# Patient Record
Sex: Female | Born: 1985 | Race: Black or African American | Hispanic: No | Marital: Single | Smoking: Never smoker
Health system: Southern US, Community
[De-identification: ages and names within clinical notes are randomized; demographics above are authoritative.]

## PROBLEM LIST (undated history)

## (undated) DIAGNOSIS — D573 Sickle-cell trait: Secondary | ICD-10-CM

## (undated) DIAGNOSIS — E7800 Pure hypercholesterolemia, unspecified: Secondary | ICD-10-CM

---

## 2011-06-04 ENCOUNTER — Encounter: Payer: Self-pay | Admitting: *Deleted

## 2011-06-04 ENCOUNTER — Emergency Department (HOSPITAL_BASED_OUTPATIENT_CLINIC_OR_DEPARTMENT_OTHER)
Admission: EM | Admit: 2011-06-04 | Discharge: 2011-06-05 | Disposition: A | Discharge status: Other Acute Inpatient Hospital | Payer: Medicaid Other | Attending: Emergency Medicine | Admitting: Emergency Medicine

## 2011-06-04 DIAGNOSIS — N12 Tubulo-interstitial nephritis, not specified as acute or chronic: Secondary | ICD-10-CM

## 2011-06-04 DIAGNOSIS — O26899 Other specified pregnancy related conditions, unspecified trimester: Secondary | ICD-10-CM

## 2011-06-04 DIAGNOSIS — N390 Urinary tract infection, site not specified: Secondary | ICD-10-CM

## 2011-06-04 DIAGNOSIS — O2390 Unspecified genitourinary tract infection in pregnancy, unspecified trimester: Secondary | ICD-10-CM | POA: Insufficient documentation

## 2011-06-04 DIAGNOSIS — R109 Unspecified abdominal pain: Secondary | ICD-10-CM | POA: Insufficient documentation

## 2011-06-04 HISTORY — DX: Sickle-cell trait: D57.3

## 2011-06-04 LAB — BASIC METABOLIC PANEL
BUN: 5 mg/dL — ABNORMAL LOW (ref 6–23)
CO2: 25 mEq/L (ref 19–32)
Calcium: 9.4 mg/dL (ref 8.4–10.5)
Chloride: 102 mEq/L (ref 96–112)
Creatinine, Ser: 0.7 mg/dL (ref 0.50–1.10)
GFR calc Af Amer: >60 mL/min (ref >60)
GFR calc non Af Amer: >60 mL/min (ref >60)
Glucose, Bld: 86 mg/dL (ref 70–99)
Potassium: 3.2 mEq/L — ABNORMAL LOW (ref 3.5–5.1)
Sodium: 137 mEq/L (ref 135–145)

## 2011-06-04 LAB — URINALYSIS, ROUTINE W REFLEX MICROSCOPIC
Glucose, UA: 100 mg/dL — AB
Ketones, ur: NEGATIVE mg/dL
Nitrite: NEGATIVE
Protein, ur: NEGATIVE mg/dL
Urobilinogen, UA: 0.2 mg/dL (ref 0.0–1.0)

## 2011-06-04 LAB — DIFFERENTIAL
Basophils Relative: 0 % (ref 0–1)
Eosinophils Absolute: 0.3 10*3/uL (ref 0.0–0.7)
Lymphs Abs: 2.1 10*3/uL (ref 0.7–4.0)
Monocytes Absolute: 1.3 10*3/uL — ABNORMAL HIGH (ref 0.1–1.0)
Monocytes Relative: 9 % (ref 3–12)
Neutrophils Relative %: 74 % (ref 43–77)

## 2011-06-04 LAB — CBC
HCT: 32.5 % — ABNORMAL LOW (ref 36.0–46.0)
Hemoglobin: 11.4 g/dL — ABNORMAL LOW (ref 12.0–15.0)
MCH: 31.5 pg (ref 26.0–34.0)
MCHC: 35.1 g/dL (ref 30.0–36.0)
MCV: 89.8 fL (ref 78.0–100.0)
Platelets: 309 10*3/uL (ref 150–400)
RBC: 3.62 MIL/uL — ABNORMAL LOW (ref 3.87–5.11)
RDW: 11.6 % (ref 11.5–15.5)
WBC: 14.1 10*3/uL — ABNORMAL HIGH (ref 4.0–10.5)

## 2011-06-04 LAB — URINE MICROSCOPIC-ADD ON

## 2011-06-04 NOTE — ED Notes (Signed)
I placed a call to [facility] for Dr. Alfredo Batty. Paged with [phone] and call returned @1120 .

## 2011-06-04 NOTE — ED Notes (Signed)
Pt is 4 months pregnant. C/O RLQ and right flank pain onset last Wed. +nausea. Denies discharge or UTI S/S

## 2011-06-04 NOTE — ED Provider Notes (Signed)
History     CSN: [id number] Arrival date & time: 06/04/2011 10:21 PM  Scribed for Olivia Mackie, MD, the patient was seen in room MH08/MH08. This chart was scribed by Katha Cabal. This patient's care was started at 10:52 PM.   Chief Complaint  Patient presents with  . Abdominal Pain     HPI Donna Pearson is a 25 y.o. female who presents to the Emergency Department complaining of gradual worsening of bilateral suprapubic abdominal pain with associated right flank pain, light headedness and nausea onset 4 days ago.  Denies dysuria, fever, vomiting, vaginal discharge, vaginal bleeding, urinary frequency and urinary urgency, and injury.  Patient has not taken anything for pain.  Patient is 17wks and 5 days pregnant (due date 11/07/11).  Denies round ligament pain with first pregnancy but did have a C Section (prolonged labor).   Patient has a 25 year old at home.  Patient states she was trying to wait until her next appointment to discuss pain with her OB GYN.     Dr. Alfredo Batty with [facility]   PAST MEDICAL HISTORY:  History reviewed. No pertinent past medical history.  PAST SURGICAL HISTORY:  C Section 2009  FAMILY HISTORY:  History reviewed. No pertinent family history.   SOCIAL HISTORY: History   Social History  . Marital Status: Single    Spouse Name: N/A    Number of Children: N/A  . Years of Education: N/A   Social History Main Topics  . Smoking status: None  . Smokeless tobacco: None  . Alcohol Use:   . Drug Use:   . Sexually Active:    Other Topics Concern  . None   Social History Narrative  . None    OB History    Grav Para Term Preterm Abortions TAB SAB Ect Mult Living   2 1              Review of Systems 10 Systems reviewed and are negative for acute change except as noted in the HPI.  Allergies  Review of patient's allergies indicates no known allergies.  Home Medications   Current Outpatient Rx  Name Route Sig Dispense Refill  .  PRENATAL 27-0.8 MG PO TABS Oral Take 1 tablet by mouth daily.        Physical Exam    BP 125/85  Pulse 85  Temp(Src) 99 F (37.2 C) (Oral)  Resp 18  Ht 5\' 5"  (1.651 m)  Wt 145 lb (65.772 kg)  BMI 24.13 kg/m2  SpO2 100%  Physical Exam  Nursing note and vitals reviewed. Constitutional: She is oriented to person, place, and time. She appears well-developed and well-nourished.  HENT:  Head: Normocephalic and atraumatic.  Eyes: Pupils are equal, round, and reactive to light.  Neck: Neck supple.  Cardiovascular: Normal rate and regular rhythm.   Pulmonary/Chest: Effort normal. No respiratory distress.  Abdominal: Soft. There is CVA tenderness (right ). There is no rebound and no guarding.       Suprapubic and  RLQ tenderness   Genitourinary:       Uterus 2 fingers below umbilicus   Musculoskeletal: Normal range of motion.  Neurological: She is alert and oriented to person, place, and time.  Skin: Skin is warm and dry. No rash noted.  Psychiatric: She has a normal mood and affect. Her behavior is normal.    ED Course  Procedures  OTHER DATA REVIEWED: Nursing notes, vital signs, and past medical records reviewed.  DIAGNOSTIC STUDIES: Oxygen Saturation is 100% on room air, normal by my interpretation.     LABS / RADIOLOGY:  Results for orders placed during the hospital encounter of 06/04/11  URINALYSIS, ROUTINE W REFLEX MICROSCOPIC      Component Value Range   Color, Urine YELLOW  YELLOW    Appearance CLOUDY (*) CLEAR    Specific Gravity, Urine 1.012  1.005 - 1.030    pH 5.5  5.0 - 8.0    Glucose, UA 100 (*) NEGATIVE (mg/dL)   Hgb urine dipstick NEGATIVE  NEGATIVE    Bilirubin Urine NEGATIVE  NEGATIVE    Ketones, ur NEGATIVE  NEGATIVE (mg/dL)   Protein, ur NEGATIVE  NEGATIVE (mg/dL)   Urobilinogen, UA 0.2  0.0 - 1.0 (mg/dL)   Nitrite NEGATIVE  NEGATIVE    Leukocytes, UA MODERATE (*) NEGATIVE   URINE MICROSCOPIC-ADD ON      Component Value Range   Squamous  Epithelial / LPF FEW (*) RARE    WBC, UA 21-50  <3 (WBC/hpf)   RBC / HPF 0-2  <3 (RBC/hpf)   Bacteria, UA MANY (*) RARE   PREGNANCY, URINE      Component Value Range   Preg Test, Ur POSITIVE    CBC      Component Value Range   WBC 14.1 (*) 4.0 - 10.5 (K/uL)   RBC 3.62 (*) 3.87 - 5.11 (MIL/uL)   Hemoglobin 11.4 (*) 12.0 - 15.0 (g/dL)   HCT 16.1 (*) 09.6 - 46.0 (%)   MCV 89.8  78.0 - 100.0 (fL)   MCH 31.5  26.0 - 34.0 (pg)   MCHC 35.1  30.0 - 36.0 (g/dL)   RDW 04.5  40.9 - 81.1 (%)   Platelets 309  150 - 400 (K/uL)  DIFFERENTIAL      Component Value Range   Neutrophils Relative 74  43 - 77 (%)   Neutro Abs 10.5 (*) 1.7 - 7.7 (K/uL)   Lymphocytes Relative 15  12 - 46 (%)   Lymphs Abs 2.1  0.7 - 4.0 (K/uL)   Monocytes Relative 9  3 - 12 (%)   Monocytes Absolute 1.3 (*) 0.1 - 1.0 (K/uL)   Eosinophils Relative 2  0 - 5 (%)   Eosinophils Absolute 0.3  0.0 - 0.7 (K/uL)   Basophils Relative 0  0 - 1 (%)   Basophils Absolute 0.0  0.0 - 0.1 (K/uL)  BASIC METABOLIC PANEL      Component Value Range   Sodium 137  135 - 145 (mEq/L)   Potassium 3.2 (*) 3.5 - 5.1 (mEq/L)   Chloride 102  96 - 112 (mEq/L)   CO2 25  19 - 32 (mEq/L)   Glucose, Bld 86  70 - 99 (mg/dL)   BUN 5 (*) 6 - 23 (mg/dL)   Creatinine, Ser 9.14  0.50 - 1.10 (mg/dL)   Calcium 9.4  8.4 - 78.2 (mg/dL)   GFR calc non Af Amer >60  >60 (mL/min)   GFR calc Af Amer >60  >60 (mL/min)     No results found.  ED COURSE / COORDINATION OF CARE:  10:26 PM  Order for fetal heart tone assessment.  11:19PM Consult placed for Dr. Alfredo Batty OB GYN.  Dr. Alfredo Batty was paged.   11:20PM  Dr. Achilles Dunk OB GYN returned consult call.   11:28PM Left antecubital IV placed.   11:39 PM  Fetal heart tones 154.    Orders Placed This Encounter  Procedures  . Urine culture  .  Urinalysis with microscopic  . Urine microscopic-add on  . Pregnancy, urine  . CBC  . Differential  . Basic metabolic panel  . Assess fetal heart tones  . Saline lock IV     12:27 AM Discussed elevated wbc with Dr Achilles Dunk, but overall nontoxic appearance, no fevers, but possible early pyelonephritis.  Initially he requested iv ancef and f/u in office in morning vs being admitted to [facility] if patient desired.  He reviewed the records and found she had sickle cell trait, now feels pt better served with admission to his service.  Will admit to [facility].  Findings discussed with patient.   MDM: Urinary tract infection in pregnancy, possible early pyelonephritis    MEDICATIONS GIVEN IN THE E.D. Scheduled Meds:  Continuous Infusions:    . ceFAZolin (ANCEF) IV         I personally performed the services described in this documentation, which was scribed in my presence. The recorded information has been reviewed and considered. Olivia Mackie, MD           Olivia Mackie, MD 06/05/11 [phone]

## 2011-06-05 ENCOUNTER — Encounter (HOSPITAL_BASED_OUTPATIENT_CLINIC_OR_DEPARTMENT_OTHER): Payer: Self-pay

## 2011-06-05 MED ORDER — SODIUM CHLORIDE 0.9 % IV SOLN
Freq: Once | INTRAVENOUS | Status: AC
  Administered 2011-06-05: via INTRAVENOUS

## 2011-06-05 MED ORDER — CEFAZOLIN SODIUM 1-5 GM-% IV SOLN
1.0000 g | Freq: Once | INTRAVENOUS | Status: AC
  Administered 2011-06-05: 1 g via INTRAVENOUS
  Filled 2011-06-05: qty 50

## 2011-06-05 MED ORDER — ACETAMINOPHEN 325 MG PO TABS
650.0000 mg | ORAL_TABLET | Freq: Once | ORAL | Status: AC
  Administered 2011-06-05: 650 mg via ORAL
  Filled 2011-06-05: qty 2

## 2011-06-05 NOTE — ED Notes (Signed)
All LDA's (lines, drains, airways, and wounds) were removed from documentation for discharge purposes.  At time of discharge all LDA's remained intact as were previously documented.    

## 2011-06-05 NOTE — ED Notes (Signed)
According to [facility] transport team pt is now having 9/10 back pain, they are requesting that patient receive pain medication.  Dr Norlene Campbell notified.

## 2011-06-06 LAB — URINE CULTURE: Culture  Setup Time: 201209170049

## 2014-07-20 ENCOUNTER — Encounter (HOSPITAL_BASED_OUTPATIENT_CLINIC_OR_DEPARTMENT_OTHER): Payer: Self-pay

## 2017-12-12 ENCOUNTER — Emergency Department (HOSPITAL_BASED_OUTPATIENT_CLINIC_OR_DEPARTMENT_OTHER)
Admission: EM | Admit: 2017-12-12 | Discharge: 2017-12-12 | Disposition: A | Discharge status: Home / Self Care | Payer: Self-pay | Attending: Emergency Medicine | Admitting: Emergency Medicine

## 2017-12-12 ENCOUNTER — Encounter (HOSPITAL_BASED_OUTPATIENT_CLINIC_OR_DEPARTMENT_OTHER): Payer: Self-pay

## 2017-12-12 DIAGNOSIS — R300 Dysuria: Secondary | ICD-10-CM | POA: Insufficient documentation

## 2017-12-12 DIAGNOSIS — N3001 Acute cystitis with hematuria: Secondary | ICD-10-CM | POA: Insufficient documentation

## 2017-12-12 DIAGNOSIS — M545 Low back pain, unspecified: Secondary | ICD-10-CM

## 2017-12-12 DIAGNOSIS — G8929 Other chronic pain: Secondary | ICD-10-CM | POA: Insufficient documentation

## 2017-12-12 DIAGNOSIS — Z79899 Other long term (current) drug therapy: Secondary | ICD-10-CM | POA: Insufficient documentation

## 2017-12-12 LAB — URINALYSIS, ROUTINE W REFLEX MICROSCOPIC
BILIRUBIN URINE: NEGATIVE
Glucose, UA: NEGATIVE mg/dL
Ketones, ur: NEGATIVE mg/dL
NITRITE: NEGATIVE
PROTEIN: NEGATIVE mg/dL
Specific Gravity, Urine: 1.02 (ref 1.005–1.030)
pH: 6 (ref 5.0–8.0)

## 2017-12-12 LAB — BASIC METABOLIC PANEL
ANION GAP: 7 (ref 5–15)
BUN: 10 mg/dL (ref 6–20)
CALCIUM: 8.6 mg/dL — AB (ref 8.9–10.3)
CO2: 24 mmol/L (ref 22–32)
Chloride: 107 mmol/L (ref 101–111)
Creatinine, Ser: 0.96 mg/dL (ref 0.44–1.00)
GFR calc Af Amer: >60 mL/min (ref >60)
Glucose, Bld: 93 mg/dL (ref 65–99)
POTASSIUM: 3.9 mmol/L (ref 3.5–5.1)
SODIUM: 138 mmol/L (ref 135–145)

## 2017-12-12 LAB — URINALYSIS, MICROSCOPIC (REFLEX)

## 2017-12-12 LAB — PREGNANCY, URINE: PREG TEST UR: NEGATIVE

## 2017-12-12 LAB — CBC
HCT: 36.2 % (ref 36.0–46.0)
Hemoglobin: 12.4 g/dL (ref 12.0–15.0)
MCH: 30.2 pg (ref 26.0–34.0)
MCHC: 34.3 g/dL (ref 30.0–36.0)
MCV: 88.1 fL (ref 78.0–100.0)
PLATELETS: 352 10*3/uL (ref 150–400)
RBC: 4.11 MIL/uL (ref 3.87–5.11)
RDW: 12 % (ref 11.5–15.5)
WBC: 9.5 10*3/uL (ref 4.0–10.5)

## 2017-12-12 MED ORDER — CEPHALEXIN 500 MG PO CAPS: 500.0000 mg | ORAL_CAPSULE | Freq: Four times a day (QID) | ORAL | 0 refills | Status: DC

## 2017-12-12 MED FILL — CEPHALEXIN 500 MG CAPSULE: 500 | 7 days supply | Qty: 28 | Fill #0

## 2017-12-12 NOTE — ED Provider Notes (Signed)
[facility] EMERGENCY DEPARTMENT Provider Note   CSN: [id number] Arrival date & time: 12/12/17  0809     History   Chief Complaint Chief Complaint  Patient presents with  . Back Pain    HPI Donna Pearson is a 32 y.o. female.  Patient with complaint of bilateral low back pain for about a year.  On and off.  Little worse the past few days.  No history of injury or fall.  No radiation of pain in the buttocks or the legs no numbness or weakness in the feet.  Also associated with some dysuria just for the past few days.  Patient denies any fevers nausea vomiting.  Any unusual rash.  Patient states that the back pain is bilateral both sides equally.     Past Medical History:  Diagnosis Date  . Sickle cell trait (HCC)     There are no active problems to display for this patient.   History reviewed. No pertinent surgical history.   OB History    Gravida  2   Para  1   Term      Preterm      AB      Living        SAB      TAB      Ectopic      Multiple      Live Births               Home Medications    Prior to Admission medications   Medication Sig Start Date End Date Taking? Authorizing Provider  cephALEXin (KEFLEX) 500 MG capsule Take 1 capsule (500 mg total) by mouth 4 (four) times daily. 12/12/17   Vanetta MuldersZackowski, Sharna Gabrys, MD  Prenatal Vit-Fe Fumarate-FA (MULTIVITAMIN-PRENATAL) 27-0.8 MG TABS Take 1 tablet by mouth daily.      [provider]    Family History No family history on file.  Social History Social History   Tobacco Use  . Smoking status: Never Smoker  . Smokeless tobacco: Never Used  Substance Use Topics  . Alcohol use: Not Currently  . Drug use: Not Currently     Allergies   Patient has no known allergies.   Review of Systems Review of Systems  Constitutional: Negative for fever.  HENT: Negative for congestion.   Eyes: Negative for redness.  Respiratory: Negative for shortness of breath.     Cardiovascular: Negative for chest pain.  Gastrointestinal: Negative for nausea and vomiting.  Genitourinary: Positive for dysuria.  Musculoskeletal: Positive for back pain.  Skin: Negative for rash.  Neurological: Negative for weakness and numbness.  Hematological: Does not bruise/bleed easily.  Psychiatric/Behavioral: Negative for confusion.     Physical Exam Updated Vital Signs BP 137/90 (BP Location: Left Arm)   Pulse 81   Temp 99.4 F (37.4 C) (Oral)   Resp 18   LMP 11/20/2017   SpO2 100%   Breastfeeding? Unknown   Physical Exam  Constitutional: She is oriented to person, place, and time. She appears well-developed and well-nourished. No distress.  HENT:  Head: Normocephalic and atraumatic.  Mouth/Throat: Oropharynx is clear and moist.  Eyes: Pupils are equal, round, and reactive to light. Conjunctivae and EOM are normal.  Neck: Neck supple.  Cardiovascular: Normal rate and regular rhythm.  Pulmonary/Chest: Effort normal and breath sounds normal.  Abdominal: Soft. Bowel sounds are normal. There is no tenderness.  Musculoskeletal: Normal range of motion. She exhibits edema.  Back nontender to palpation.  Neurological: She is  alert and oriented to person, place, and time. No cranial nerve deficit or sensory deficit. She exhibits normal muscle tone. Coordination normal.  Skin: Skin is warm. No rash noted.  Nursing note and vitals reviewed.    ED Treatments / Results  Labs (all labs ordered are listed, but only abnormal results are displayed) Labs Reviewed  URINALYSIS, ROUTINE W REFLEX MICROSCOPIC - Abnormal; Notable for the following components:      Result Value   APPearance CLOUDY (*)    Hgb urine dipstick LARGE (*)    Leukocytes, UA LARGE (*)    All other components within normal limits  BASIC METABOLIC PANEL - Abnormal; Notable for the following components:   Calcium 8.6 (*)    All other components within normal limits  URINALYSIS, MICROSCOPIC (REFLEX) -  Abnormal; Notable for the following components:   Bacteria, UA MANY (*)    Squamous Epithelial / LPF 0-5 (*)    All other components within normal limits  URINE CULTURE  PREGNANCY, URINE  CBC    EKG None  Radiology No results found.  Procedures Procedures (including critical care time)  Medications Ordered in ED Medications - No data to display   Initial Impression / Assessment and Plan / ED Course  I have reviewed the triage vital signs and the nursing notes.  Pertinent labs & imaging results that were available during my care of the patient were reviewed by me and considered in my medical decision making (see chart for details).     Patient with complaint of low back pain on and off for about a year.  Last few days some increased but not severe.  Also with some dysuria.  Patient is concerned that maybe her kidney are not working properly.  Urinalysis here today consistent with urinary tract infection.  Pregnancy test negative.  Renal functions normal.  The low back pain based on the duration sounds more musculoskeletal in nature.  Patient will be treated with Keflex for the urinary tract infection.  Work note provided.  Final Clinical Impressions(s) / ED Diagnoses   Final diagnoses:  Acute cystitis with hematuria  Chronic low back pain without sciatica, unspecified back pain laterality    ED Discharge Orders        Ordered    cephALEXin (KEFLEX) 500 MG capsule  4 times daily     12/12/17 1136       Vanetta Mulders, MD 12/12/17 1140

## 2017-12-12 NOTE — Discharge Instructions (Addendum)
Urine sent for culture.  Take the antibiotic Keflex as directed for the next 7 days.  Would expect improvement over the next 2 days.  Return for any new or worse symptoms.  Suspect that the low back pain which is been present for many months is most likely musculoskeletal in nature and not related to today's finding of the urinary tract infection.  Kidney function is normal.

## 2017-12-12 NOTE — ED Triage Notes (Signed)
Pt c/o lower back pressure for a yr, concerns it may be her kidneys

## 2017-12-14 LAB — URINE CULTURE: Culture: >=100000 — AB

## 2017-12-15 ENCOUNTER — Telehealth: Payer: Self-pay

## 2017-12-15 NOTE — Telephone Encounter (Signed)
Post ED Visit - Positive Culture Follow-up  Culture report reviewed by antimicrobial stewardship pharmacist:  []  Donna Pearson, Pharm.D. []  Donna Pearson, Pharm.D., BCPS AQ-ID []  Donna Pearson, Pharm.D., BCPS []  Donna Pearson, Pharm.D., BCPS []  Donna Pearson, [address] Rainbow BoulevardPharm.D., BCPS, AAHIVP []  Donna Pearson, Pharm.D., BCPS, AAHIVP []  Donna Pearson, PharmD, BCPS []  Donna Pearson, PharmD []  Donna Pearson, PharmD, BCPS [facility] Pharm D Positive urine culture Treated with Cephalexin, organism sensitive to the same and no further patient follow-up is required at this time.  Donna Pearson, Donna Pearson 12/15/2017, 10:42 AM

## 2019-03-13 ENCOUNTER — Other Ambulatory Visit: Payer: Self-pay

## 2019-03-13 ENCOUNTER — Emergency Department (HOSPITAL_BASED_OUTPATIENT_CLINIC_OR_DEPARTMENT_OTHER)
Admission: EM | Admit: 2019-03-13 | Discharge: 2019-03-13 | Disposition: A | Discharge status: Home / Self Care | Payer: Self-pay | Attending: Emergency Medicine | Admitting: Emergency Medicine

## 2019-03-13 ENCOUNTER — Encounter (HOSPITAL_BASED_OUTPATIENT_CLINIC_OR_DEPARTMENT_OTHER): Payer: Self-pay | Admitting: *Deleted

## 2019-03-13 DIAGNOSIS — N390 Urinary tract infection, site not specified: Secondary | ICD-10-CM | POA: Insufficient documentation

## 2019-03-13 DIAGNOSIS — R300 Dysuria: Secondary | ICD-10-CM

## 2019-03-13 DIAGNOSIS — Z79899 Other long term (current) drug therapy: Secondary | ICD-10-CM | POA: Insufficient documentation

## 2019-03-13 LAB — URINALYSIS, ROUTINE W REFLEX MICROSCOPIC
Bilirubin Urine: NEGATIVE
Glucose, UA: NEGATIVE mg/dL
Ketones, ur: NEGATIVE mg/dL
Nitrite: NEGATIVE
Protein, ur: NEGATIVE mg/dL
Specific Gravity, Urine: 1.02 (ref 1.005–1.030)
pH: 6 (ref 5.0–8.0)

## 2019-03-13 LAB — URINALYSIS, MICROSCOPIC (REFLEX)

## 2019-03-13 LAB — PREGNANCY, URINE: Preg Test, Ur: NEGATIVE

## 2019-03-13 MED ORDER — CEPHALEXIN 500 MG PO CAPS: 500.0000 mg | ORAL_CAPSULE | Freq: Two times a day (BID) | ORAL | 0 refills | Status: AC

## 2019-03-13 NOTE — Discharge Instructions (Addendum)
I have prescribed antibiotics to help treat  your urinary tract infection. Please take one tablet twice a day for the next 7 days. If you experience any back pain, fever please return to the ED.

## 2019-03-13 NOTE — ED Triage Notes (Signed)
Urinary frequency and dysuria since this am. She has had diarrhea all day.

## 2019-03-13 NOTE — ED Notes (Signed)
ED Provider at bedside. 

## 2019-03-13 NOTE — ED Notes (Signed)
Pt understood dc material. NAD noted. Scripts sent electronically. All questions answered to satisfaction. Pt escorted to check out window 

## 2019-03-13 NOTE — ED Notes (Signed)
Given juice

## 2019-03-13 NOTE — ED Provider Notes (Signed)
[facility] EMERGENCY DEPARTMENT Provider Note   CSN: [id number] Arrival date & time: 03/13/19  1722    History   Chief Complaint No chief complaint on file.   HPI Donna Pearson is a [age] y.o. female.     33 y.o female with a PMH of sickle cell trait, UTIs presents to the ED with a chief complaint of diarrhea and dysuria x this morning. Patient is currently Donna Pearson as a CNA, reports while she was at work today she began to feel the need to use the restroom, had one episode of diarrhea while at work, this later continued to having multiple episodes for a total of 4 episodes of nonbloody diarrhea.  Reports when she arrived at home she attempted to lay down but when she attempted to urinate she experience some dysuria, no hematuria.  Patient reports she has not ate much today, had some ravioli's along with half of pizza.  She denies any emesis episodes.  Patient denies any fevers, back pain, abdominal pain, vaginal discharge or bleeding.  The history is provided by the patient.    Past Medical History:  Diagnosis Date  . Sickle cell trait (HCC)     There are no active problems to display for this patient.   History reviewed. No pertinent surgical history.   OB History    Gravida  2   Para  1   Term      Preterm      AB      Living        SAB      TAB      Ectopic      Multiple      Live Births               Home Medications    Prior to Admission medications   Medication Sig Start Date End Date Taking? Authorizing Provider  cephALEXin (KEFLEX) 500 MG capsule Take 1 capsule (500 mg total) by mouth 2 (two) times daily for 7 days. 03/13/19 03/20/19  Claude MangesSoto, Seher Schlagel, PA-C  Prenatal Vit-Fe Fumarate-FA (MULTIVITAMIN-PRENATAL) 27-0.8 MG TABS Take 1 tablet by mouth daily.      [provider]    Family History No family history on file.  Social History Social History   Tobacco Use  . Smoking status: Never Smoker  . Smokeless tobacco: Never  Used  Substance Use Topics  . Alcohol use: Not Currently  . Drug use: Not Currently     Allergies   Patient has no known allergies.   Review of Systems Review of Systems  Constitutional: Negative for chills and fever.  HENT: Negative for ear pain and sore throat.   Eyes: Negative for pain and visual disturbance.  Respiratory: Negative for cough and shortness of breath.   Cardiovascular: Negative for chest pain and palpitations.  Gastrointestinal: Negative for abdominal pain and vomiting.  Genitourinary: Positive for difficulty urinating and dysuria. Negative for flank pain, hematuria, vaginal bleeding and vaginal discharge.  Musculoskeletal: Negative for arthralgias and back pain.  Skin: Negative for color change and rash.  Neurological: Negative for seizures and syncope.  All other systems reviewed and are negative.    Physical Exam Updated Vital Signs BP 126/84   Pulse 92   Temp 99.1 F (37.3 C) (Oral)   Resp 20   Ht 5\' 5"  (1.651 m)   Wt 94.3 kg   LMP 02/14/2019   SpO2 99%   BMI 34.61 kg/m   Physical Exam Vitals  signs and nursing note reviewed.  Constitutional:      General: She is not in acute distress.    Appearance: She is well-developed.     Comments: Non ill appearing.   HENT:     Head: Normocephalic and atraumatic.     Mouth/Throat:     Pharynx: No oropharyngeal exudate.  Eyes:     Pupils: Pupils are equal, round, and reactive to light.  Neck:     Musculoskeletal: Normal range of motion.  Cardiovascular:     Rate and Rhythm: Regular rhythm.     Heart sounds: Normal heart sounds.  Pulmonary:     Effort: Pulmonary effort is normal. No respiratory distress.     Breath sounds: Normal breath sounds.  Abdominal:     General: Bowel sounds are normal. There is no distension.     Palpations: Abdomen is soft.     Tenderness: There is no abdominal tenderness.  Musculoskeletal:        General: No tenderness or deformity.     Right lower leg: No edema.      Left lower leg: No edema.  Skin:    General: Skin is warm and dry.  Neurological:     Mental Status: She is alert and oriented to person, place, and time.      ED Treatments / Results  Labs (all labs ordered are listed, but only abnormal results are displayed) Labs Reviewed  URINALYSIS, ROUTINE W REFLEX MICROSCOPIC - Abnormal; Notable for the following components:      Result Value   APPearance CLOUDY (*)    Hgb urine dipstick LARGE (*)    Leukocytes,Ua SMALL (*)    All other components within normal limits  URINALYSIS, MICROSCOPIC (REFLEX) - Abnormal; Notable for the following components:   Bacteria, UA FEW (*)    All other components within normal limits  URINE CULTURE  PREGNANCY, URINE    EKG None  Radiology No results found.  Procedures Procedures (including critical care time)  Medications Ordered in ED Medications - No data to display   Initial Impression / Assessment and Plan / ED Course  I have reviewed the triage vital signs and the nursing notes.  Pertinent labs & imaging results that were available during my care of the patient were reviewed by me and considered in my medical decision making (see chart for details).     Patient with no past medical history presents to the ED with symptoms of dysuria x1 day.  Reports having multiple episodes of diarrhea at this morning, then reported dysuria while at home.  She denies any fevers, hematuria, vaginal bleeding or vaginal discharge.  Patient with otherwise unremarkable signs while in the ED.  UA showed appearance is cloudy, large hemoglobin, small leukocytes, nitrite negative.  UA showed white blood cell count of 21-50, few bacteria.  Urine pregnancy is negative at this time.  I have discussed these results with patient, patient will be placed on Keflex, she is to take 1 tablet twice a day for the next 7 days.  She is to return back to the emergency department if she experiences any fever, back pain, worsening  symptoms.  Patient understands and agrees with management.  Portions of this note were generated with [employer]. Dictation errors may occur despite best attempts at proofreading.    Final Clinical Impressions(s) / ED Diagnoses   Final diagnoses:  Dysuria  Acute urinary tract infection    ED Discharge Orders  Ordered    cephALEXin (KEFLEX) 500 MG capsule  2 times daily     03/13/19 1914           Claude MangesSoto, Lavel Rieman, PA-C 03/13/19 1921    Virgina NorfolkCuratolo, Adam, DO 03/13/19 2000

## 2019-03-15 LAB — URINE CULTURE: Culture: 30000 — AB

## 2019-03-16 ENCOUNTER — Telehealth: Payer: Self-pay | Admitting: Emergency Medicine

## 2019-03-16 NOTE — Telephone Encounter (Signed)
Post ED Visit - Positive Culture Follow-up  Culture report reviewed by antimicrobial stewardship pharmacist: [place] Team []  Elenor Quinones, Pharm.D. []  Heide Guile, Pharm.D., BCPS AQ-ID []  Parks Neptune, Pharm.D., BCPS []  Alycia Rossetti, Pharm.D., BCPS []  [place], [place].D., BCPS, AAHIVP []  Legrand Como, Pharm.D., BCPS, AAHIVP [x]  Salome Arnt, PharmD, BCPS []  Johnnette Gourd, PharmD, BCPS []  Hughes Better, PharmD, BCPS []  Leeroy Cha, PharmD []  Laqueta Linden, PharmD, BCPS []  Albertina Parr, PharmD  [place] Team []  Leodis Sias, PharmD []  Lindell Spar, PharmD []  Royetta Asal, PharmD []  Graylin Shiver, Rph []  Rema Fendt) Glennon Mac, PharmD []  Arlyn Dunning, PharmD []  Netta Cedars, PharmD []  Dia Sitter, PharmD []  Leone Haven, PharmD []  Gretta Arab, PharmD []  Theodis Shove, PharmD []  Peggyann Juba, PharmD []  Reuel Boom, PharmD   Positive urine culture Treated with Cephalexin, organism sensitive to the same and no further patient follow-up is required at this time.  Donna Pearson 03/16/2019, 1:37 PM

## 2019-05-13 ENCOUNTER — Encounter (HOSPITAL_BASED_OUTPATIENT_CLINIC_OR_DEPARTMENT_OTHER): Payer: Self-pay | Admitting: Emergency Medicine

## 2019-05-13 ENCOUNTER — Other Ambulatory Visit: Payer: Self-pay

## 2019-05-13 ENCOUNTER — Emergency Department (HOSPITAL_BASED_OUTPATIENT_CLINIC_OR_DEPARTMENT_OTHER)
Admission: EM | Admit: 2019-05-13 | Discharge: 2019-05-13 | Disposition: A | Discharge status: Home / Self Care | Payer: BLUE CROSS/BLUE SHIELD | Attending: Emergency Medicine | Admitting: Emergency Medicine

## 2019-05-13 DIAGNOSIS — N939 Abnormal uterine and vaginal bleeding, unspecified: Secondary | ICD-10-CM | POA: Diagnosis not present

## 2019-05-13 DIAGNOSIS — A5901 Trichomonal vulvovaginitis: Secondary | ICD-10-CM | POA: Insufficient documentation

## 2019-05-13 DIAGNOSIS — R519 Headache, unspecified: Secondary | ICD-10-CM

## 2019-05-13 DIAGNOSIS — R51 Headache: Secondary | ICD-10-CM | POA: Diagnosis present

## 2019-05-13 LAB — CBC WITH DIFFERENTIAL/PLATELET
Abs Immature Granulocytes: 0.02 10*3/uL (ref 0.00–0.07)
Basophils Absolute: 0.1 10*3/uL (ref 0.0–0.1)
Basophils Relative: 1 %
Eosinophils Absolute: 0.2 10*3/uL (ref 0.0–0.5)
Eosinophils Relative: 2 %
HCT: 38.8 % (ref 36.0–46.0)
Hemoglobin: 12.9 g/dL (ref 12.0–15.0)
Immature Granulocytes: 0 %
Lymphocytes Relative: 22 %
Lymphs Abs: 1.7 10*3/uL (ref 0.7–4.0)
MCH: 30.3 pg (ref 26.0–34.0)
MCHC: 33.2 g/dL (ref 30.0–36.0)
MCV: 91.1 fL (ref 80.0–100.0)
Monocytes Absolute: 0.6 10*3/uL (ref 0.1–1.0)
Monocytes Relative: 8 %
Neutro Abs: 5.3 10*3/uL (ref 1.7–7.7)
Neutrophils Relative %: 67 %
Platelets: 349 10*3/uL (ref 150–400)
RBC: 4.26 MIL/uL (ref 3.87–5.11)
RDW: 11.9 % (ref 11.5–15.5)
WBC: 7.9 10*3/uL (ref 4.0–10.5)
nRBC: 0 % (ref 0.0–0.2)

## 2019-05-13 LAB — URINALYSIS, ROUTINE W REFLEX MICROSCOPIC
Bilirubin Urine: NEGATIVE
Glucose, UA: NEGATIVE mg/dL
Ketones, ur: NEGATIVE mg/dL
Nitrite: NEGATIVE
Protein, ur: NEGATIVE mg/dL
Specific Gravity, Urine: 1.015 (ref 1.005–1.030)
pH: 6.5 (ref 5.0–8.0)

## 2019-05-13 LAB — COMPREHENSIVE METABOLIC PANEL
ALT: 14 U/L (ref 0–44)
AST: 21 U/L (ref 15–41)
Albumin: 4 g/dL (ref 3.5–5.0)
Alkaline Phosphatase: 60 U/L (ref 38–126)
Anion gap: 8 (ref 5–15)
BUN: 10 mg/dL (ref 6–20)
CO2: 24 mmol/L (ref 22–32)
Calcium: 8.9 mg/dL (ref 8.9–10.3)
Chloride: 106 mmol/L (ref 98–111)
Creatinine, Ser: 0.85 mg/dL (ref 0.44–1.00)
GFR calc Af Amer: >60 mL/min (ref >60)
GFR calc non Af Amer: >60 mL/min (ref >60)
Glucose, Bld: 93 mg/dL (ref 70–99)
Potassium: 4 mmol/L (ref 3.5–5.1)
Sodium: 138 mmol/L (ref 135–145)
Total Bilirubin: 0.8 mg/dL (ref 0.3–1.2)
Total Protein: 7.3 g/dL (ref 6.5–8.1)

## 2019-05-13 LAB — WET PREP, GENITAL
Clue Cells Wet Prep HPF POC: NONE SEEN
Sperm: NONE SEEN
Yeast Wet Prep HPF POC: NONE SEEN

## 2019-05-13 LAB — URINALYSIS, MICROSCOPIC (REFLEX)

## 2019-05-13 LAB — LIPASE, BLOOD: Lipase: 26 U/L (ref 11–51)

## 2019-05-13 LAB — PREGNANCY, URINE: Preg Test, Ur: NEGATIVE

## 2019-05-13 MED ORDER — DIPHENHYDRAMINE HCL 50 MG/ML IJ SOLN
12.5000 mg | Freq: Once | INTRAMUSCULAR | Status: AC
  Administered 2019-05-13: 12.5 mg via INTRAVENOUS
  Filled 2019-05-13: qty 1

## 2019-05-13 MED ORDER — KETOROLAC TROMETHAMINE 30 MG/ML IJ SOLN
30.0000 mg | Freq: Once | INTRAMUSCULAR | Status: AC
  Administered 2019-05-13: 30 mg via INTRAVENOUS
  Filled 2019-05-13: qty 1

## 2019-05-13 MED ORDER — METOCLOPRAMIDE HCL 5 MG/ML IJ SOLN
10.0000 mg | Freq: Once | INTRAMUSCULAR | Status: AC
  Administered 2019-05-13: 10 mg via INTRAVENOUS
  Filled 2019-05-13: qty 2

## 2019-05-13 MED ORDER — SODIUM CHLORIDE 0.9 % IV BOLUS
1000.0000 mL | Freq: Once | INTRAVENOUS | Status: AC
  Administered 2019-05-13: 1000 mL via INTRAVENOUS

## 2019-05-13 MED ORDER — METRONIDAZOLE 500 MG PO TABS
2000.0000 mg | ORAL_TABLET | Freq: Once | ORAL | Status: AC
  Administered 2019-05-13: 2000 mg via ORAL
  Filled 2019-05-13: qty 4

## 2019-05-13 NOTE — ED Notes (Signed)
Pt given specimen cup for urine.

## 2019-05-13 NOTE — ED Notes (Signed)
ED Provider at bedside. 

## 2019-05-13 NOTE — ED Triage Notes (Signed)
Pt c/o HA x 3 weeks and reports mild abdominal pain but not at this time. Also c/o spotting, since aug 1. Denies Nausea today, 1 episode emesis. Denies fever, denies otc.

## 2019-05-13 NOTE — ED Notes (Signed)
Pt transfer information charted incorrectly on this patient. Pt not admitted at this time.

## 2019-05-13 NOTE — ED Provider Notes (Signed)
[place] EMERGENCY DEPARTMENT Provider Note   CSN: [id number] Arrival date & time: 05/13/19  1015     History   Chief Complaint Chief Complaint  Patient presents with  . Headache  . Vaginal Bleeding    HPI Donna Pearson is a 33 y.o. female.     Pt presents to the ED today with left sided headache as well as vaginal spotting.  The headache has been going on for several weeks.  It usually goes away with ibuprofen.  She has also had spotting since August 1.  She has occasional abdominal pain, but none now.  No f/c.  No known covid exposures.     Past Medical History:  Diagnosis Date  . Sickle cell trait ([place])     There are no active problems to display for this patient.   History reviewed. No pertinent surgical history.   OB History    Gravida  2   Para  1   Term      Preterm      AB      Living        SAB      TAB      Ectopic      Multiple      Live Births               Home Medications    Prior to Admission medications   Medication Sig Start Date End Date Taking? Authorizing Provider  Prenatal Vit-Fe Fumarate-FA (MULTIVITAMIN-PRENATAL) 27-0.8 MG TABS Take 1 tablet by mouth daily.      [provider]    Family History History reviewed. No pertinent family history.  Social History Social History   Tobacco Use  . Smoking status: Never Smoker  . Smokeless tobacco: Never Used  Substance Use Topics  . Alcohol use: Not Currently  . Drug use: Not Currently     Allergies   Patient has no known allergies.   Review of Systems Review of Systems  Gastrointestinal: Positive for abdominal pain.  Genitourinary: Positive for vaginal bleeding.  Neurological: Positive for headaches.  All other systems reviewed and are negative.    Physical Exam Updated Vital Signs BP (!) 144/109 (BP Location: Right Arm)   Pulse 78   Temp 99.4 F (37.4 C) (Oral)   Resp 18   Ht 5\' 6"  (1.676 m)   Wt 95.3 kg   LMP  04/19/2019 (Approximate)   SpO2 100%   BMI 33.89 kg/m   Physical Exam Vitals signs and nursing note reviewed. Exam conducted with a chaperone present.  Constitutional:      Appearance: She is well-developed.  HENT:     Head: Normocephalic and atraumatic.     Mouth/Throat:     Mouth: Mucous membranes are moist.     Pharynx: Oropharynx is clear.  Eyes:     Extraocular Movements: Extraocular movements intact.     Pupils: Pupils are equal, round, and reactive to light.  Neck:     Musculoskeletal: Normal range of motion and neck supple.  Cardiovascular:     Rate and Rhythm: Normal rate and regular rhythm.  Pulmonary:     Effort: Pulmonary effort is normal.     Breath sounds: Normal breath sounds.  Abdominal:     General: Bowel sounds are normal.     Palpations: Abdomen is soft.  Genitourinary:    General: Normal vulva.     Exam position: Lithotomy position.     Vagina: Normal.  Cervix: Discharge present.     Uterus: Normal.      Adnexa: Right adnexa normal and left adnexa normal.  Musculoskeletal: Normal range of motion.  Skin:    General: Skin is warm and dry.     Capillary Refill: Capillary refill takes less than 2 seconds.  Neurological:     Mental Status: She is alert and oriented to person, place, and time.  Psychiatric:        Mood and Affect: Mood normal.        Behavior: Behavior normal.      ED Treatments / Results  Labs (all labs ordered are listed, but only abnormal results are displayed) Labs Reviewed  WET PREP, GENITAL - Abnormal; Notable for the following components:      Result Value   Trich, Wet Prep PRESENT (*)    WBC, Wet Prep HPF POC MANY (*)    All other components within normal limits  URINALYSIS, ROUTINE W REFLEX MICROSCOPIC - Abnormal; Notable for the following components:   Hgb urine dipstick SMALL (*)    Leukocytes,Ua SMALL (*)    All other components within normal limits  URINALYSIS, MICROSCOPIC (REFLEX) - Abnormal; Notable for the  following components:   Bacteria, UA FEW (*)    Trichomonas, UA PRESENT (*)    All other components within normal limits  CBC WITH DIFFERENTIAL/PLATELET  COMPREHENSIVE METABOLIC PANEL  LIPASE, BLOOD  PREGNANCY, URINE  GC/CHLAMYDIA PROBE AMP ([place]) NOT [facility]    EKG None  Radiology No results found.  Procedures Procedures (including critical care time)  Medications Ordered in ED Medications  ketorolac (TORADOL) 30 MG/ML injection 30 mg (has no administration in time range)  metroNIDAZOLE (FLAGYL) tablet 2,000 mg (has no administration in time range)  sodium chloride 0.9 % bolus 1,000 mL (1,000 mLs Intravenous New Bag/Given 05/13/19 1056)  metoCLOPramide (REGLAN) injection 10 mg (10 mg Intravenous Given 05/13/19 1053)  diphenhydrAMINE (BENADRYL) injection 12.5 mg (12.5 mg Intravenous Given 05/13/19 1053)     Initial Impression / Assessment and Plan / ED Course  I have reviewed the triage vital signs and the nursing notes.  Pertinent labs & imaging results that were available during my care of the patient were reviewed by me and considered in my medical decision making (see chart for details).     Pt will be treated for trich with flagyl.  She is d/c home with instructions to return if worse.  F/u with pcp and with [facility].  Final Clinical Impressions(s) / ED Diagnoses   Final diagnoses:  Trichomonal vaginitis  Acute nonintractable headache, unspecified headache type    ED Discharge Orders    None       Jacalyn LefevreHaviland, Emery Dupuy, MD 05/13/19 1141

## 2019-05-14 LAB — GC/CHLAMYDIA PROBE AMP (~~LOC~~) NOT AT ARMC
Chlamydia: NEGATIVE
Neisseria Gonorrhea: NEGATIVE

## 2019-05-16 ENCOUNTER — Emergency Department (HOSPITAL_BASED_OUTPATIENT_CLINIC_OR_DEPARTMENT_OTHER)
Admission: EM | Admit: 2019-05-16 | Discharge: 2019-05-17 | Disposition: A | Discharge status: Home / Self Care | Payer: BLUE CROSS/BLUE SHIELD | Attending: Emergency Medicine | Admitting: Emergency Medicine

## 2019-05-16 ENCOUNTER — Encounter (HOSPITAL_BASED_OUTPATIENT_CLINIC_OR_DEPARTMENT_OTHER): Payer: Self-pay

## 2019-05-16 ENCOUNTER — Emergency Department (HOSPITAL_BASED_OUTPATIENT_CLINIC_OR_DEPARTMENT_OTHER): Payer: BLUE CROSS/BLUE SHIELD

## 2019-05-16 ENCOUNTER — Other Ambulatory Visit: Payer: Self-pay

## 2019-05-16 DIAGNOSIS — Z79899 Other long term (current) drug therapy: Secondary | ICD-10-CM | POA: Diagnosis not present

## 2019-05-16 DIAGNOSIS — H53149 Visual discomfort, unspecified: Secondary | ICD-10-CM | POA: Insufficient documentation

## 2019-05-16 DIAGNOSIS — R51 Headache: Secondary | ICD-10-CM | POA: Diagnosis present

## 2019-05-16 DIAGNOSIS — G43801 Other migraine, not intractable, with status migrainosus: Secondary | ICD-10-CM | POA: Insufficient documentation

## 2019-05-16 MED ORDER — DEXAMETHASONE SODIUM PHOSPHATE 10 MG/ML IJ SOLN
10.0000 mg | Freq: Once | INTRAMUSCULAR | Status: AC
  Administered 2019-05-17: 10 mg via INTRAVENOUS
  Filled 2019-05-16: qty 1

## 2019-05-16 MED ORDER — PROCHLORPERAZINE EDISYLATE 10 MG/2ML IJ SOLN
10.0000 mg | Freq: Once | INTRAMUSCULAR | Status: AC
  Administered 2019-05-17: 10 mg via INTRAVENOUS
  Filled 2019-05-16: qty 2

## 2019-05-16 MED ORDER — DIPHENHYDRAMINE HCL 50 MG/ML IJ SOLN
25.0000 mg | Freq: Once | INTRAMUSCULAR | Status: AC
  Administered 2019-05-17: 25 mg via INTRAVENOUS
  Filled 2019-05-16: qty 1

## 2019-05-16 MED ORDER — KETOROLAC TROMETHAMINE 30 MG/ML IJ SOLN
30.0000 mg | Freq: Once | INTRAMUSCULAR | Status: AC
  Administered 2019-05-17: 30 mg via INTRAVENOUS
  Filled 2019-05-16: qty 1

## 2019-05-16 MED ORDER — SODIUM CHLORIDE 0.9 % IV BOLUS
1000.0000 mL | Freq: Once | INTRAVENOUS | Status: AC
  Administered 2019-05-17: 1000 mL via INTRAVENOUS

## 2019-05-16 NOTE — ED Triage Notes (Signed)
Pt c/o HA x 1 week-states she was seen here for same-states she checked BP today and it was "160/114"-NAD-steady gait

## 2019-05-16 NOTE — ED Provider Notes (Signed)
[facility] EMERGENCY DEPARTMENT Provider Note   CSN: [id number] Arrival date & time: 05/16/19  2217     History   Chief Complaint Chief Complaint  Patient presents with  . Headache    HPI Donna Pearson is a 33 y.o. female.     Patient presents to the emergency department for evaluation of headache.  Patient reports that she was seen in the ER for headache 4 days ago.  Since then the headache has progressively worsened.  It has been mostly present continuously.  She reports its continuous throb with intermittent sharp pain.  Pain is "all over".  She does have light sensitivity and nausea, no vomiting.  No neck pain or stiffness.  No fever.  She does not have a history of migraines but there is a family history.     Past Medical History:  Diagnosis Date  . Sickle cell trait ([facility])     There are no active problems to display for this patient.   History reviewed. No pertinent surgical history.   OB History    Gravida  2   Para  1   Term      Preterm      AB      Living        SAB      TAB      Ectopic      Multiple      Live Births               Home Medications    Prior to Admission medications   Medication Sig Start Date End Date Taking? Authorizing Provider  Prenatal Vit-Fe Fumarate-FA (MULTIVITAMIN-PRENATAL) 27-0.8 MG TABS Take 1 tablet by mouth daily.      [provider]    Family History No family history on file.  Social History Social History   Tobacco Use  . Smoking status: Never Smoker  . Smokeless tobacco: Never Used  Substance Use Topics  . Alcohol use: Never    Frequency: Never  . Drug use: Never     Allergies   Patient has no known allergies.   Review of Systems Review of Systems  Neurological: Positive for headaches.  All other systems reviewed and are negative.    Physical Exam Updated Vital Signs BP (!) 166/121 (BP Location: Left Arm)   Pulse 70   Temp 99.1 F (37.3 C) (Oral)    Resp 18   Ht 5\' 6"  (1.676 m)   Wt 93.4 kg   LMP 05/14/2019   SpO2 98%   BMI 33.25 kg/m   Physical Exam Vitals signs and nursing note reviewed.  Constitutional:      General: She is not in acute distress.    Appearance: Normal appearance. She is well-developed.  HENT:     Head: Normocephalic and atraumatic.     Right Ear: Hearing normal.     Left Ear: Hearing normal.     Nose: Nose normal.  Eyes:     Conjunctiva/sclera: Conjunctivae normal.     Pupils: Pupils are equal, round, and reactive to light.  Neck:     Musculoskeletal: Normal range of motion and neck supple.  Cardiovascular:     Rate and Rhythm: Regular rhythm.     Heart sounds: S1 normal and S2 normal. No murmur. No friction rub. No gallop.   Pulmonary:     Effort: Pulmonary effort is normal. No respiratory distress.     Breath sounds: Normal breath sounds.  Chest:  Chest wall: No tenderness.  Abdominal:     General: Bowel sounds are normal.     Palpations: Abdomen is soft.     Tenderness: There is no abdominal tenderness. There is no guarding or rebound. Negative signs include Murphy's sign and McBurney's sign.     Hernia: No hernia is present.  Musculoskeletal: Normal range of motion.  Skin:    General: Skin is warm and dry.     Findings: No rash.  Neurological:     Mental Status: She is alert and oriented to person, place, and time.     GCS: GCS eye subscore is 4. GCS verbal subscore is 5. GCS motor subscore is 6.     Cranial Nerves: No cranial nerve deficit.     Sensory: No sensory deficit.     Coordination: Coordination normal.  Psychiatric:        Speech: Speech normal.        Behavior: Behavior normal.        Thought Content: Thought content normal.      ED Treatments / Results  Labs (all labs ordered are listed, but only abnormal results are displayed) Labs Reviewed  CBC WITH DIFFERENTIAL/PLATELET  CBC WITH DIFFERENTIAL/PLATELET  BASIC METABOLIC PANEL    EKG None  Radiology Ct Head  Wo Contrast  Result Date: 05/17/2019 CLINICAL DATA:  Severe headache EXAM: CT HEAD WITHOUT CONTRAST TECHNIQUE: Contiguous axial images were obtained from the base of the skull through the vertex without intravenous contrast. COMPARISON:  None. FINDINGS: Brain: No evidence of acute infarction, hemorrhage, hydrocephalus, extra-axial collection or mass lesion/mass effect. Vascular: No hyperdense vessel or unexpected calcification. Skull: Normal. Negative for fracture or focal lesion. Sinuses/Orbits: No acute finding. Other: None IMPRESSION: Negative non contrasted CT appearance of the brain Electronically Signed   By: Donavan Foil M.D.   On: 05/17/2019 00:08    Procedures Procedures (including critical care time)  Medications Ordered in ED Medications  sodium chloride 0.9 % bolus 1,000 mL (1,000 mLs Intravenous New Bag/Given 05/17/19 0026)  dexamethasone (DECADRON) injection 10 mg (10 mg Intravenous Given 05/17/19 0035)  ketorolac (TORADOL) 30 MG/ML injection 30 mg (30 mg Intravenous Given 05/17/19 0028)  prochlorperazine (COMPAZINE) injection 10 mg (10 mg Intravenous Given 05/17/19 0029)  diphenhydrAMINE (BENADRYL) injection 25 mg (25 mg Intravenous Given 05/17/19 0026)     Initial Impression / Assessment and Plan / ED Course  I have reviewed the triage vital signs and the nursing notes.  Pertinent labs & imaging results that were available during my care of the patient were reviewed by me and considered in my medical decision making (see chart for details).        Patient presents to the emergency department for evaluation of headache.  Patient was seen in the ER 4 days ago with similar headache.  Since she was seen, however, the pain has worsened.  She reports a global, throbbing headache with intermittent sharp component.  She has light sensitivity.  This does seem migrainous in nature.  She does not have a history of migraines but there is a family history.  She has no neurologic findings on  exam.  Appears healthy.  No signs of infection.  CT head unremarkable.  She has significant improvement after IV fluids and migraine cocktail.  Will refer to neurology for follow-up.  Final Clinical Impressions(s) / ED Diagnoses   Final diagnoses:  Other migraine with status migrainosus, not intractable    ED Discharge Orders    None  Gilda CreasePollina, Christopher J, MD 05/17/19 0111

## 2019-05-17 LAB — BASIC METABOLIC PANEL
Anion gap: 8 (ref 5–15)
BUN: 9 mg/dL (ref 6–20)
CO2: 25 mmol/L (ref 22–32)
Calcium: 8.5 mg/dL — ABNORMAL LOW (ref 8.9–10.3)
Chloride: 105 mmol/L (ref 98–111)
Creatinine, Ser: 1.03 mg/dL — ABNORMAL HIGH (ref 0.44–1.00)
GFR calc Af Amer: >60 mL/min (ref >60)
GFR calc non Af Amer: >60 mL/min (ref >60)
Glucose, Bld: 101 mg/dL — ABNORMAL HIGH (ref 70–99)
Potassium: 3.4 mmol/L — ABNORMAL LOW (ref 3.5–5.1)
Sodium: 138 mmol/L (ref 135–145)

## 2019-05-17 LAB — CBC WITH DIFFERENTIAL/PLATELET
Abs Immature Granulocytes: 0.03 10*3/uL (ref 0.00–0.07)
Basophils Absolute: 0.1 10*3/uL (ref 0.0–0.1)
Basophils Relative: 1 %
Eosinophils Absolute: 0.4 10*3/uL (ref 0.0–0.5)
Eosinophils Relative: 4 %
HCT: 38.3 % (ref 36.0–46.0)
Hemoglobin: 12.6 g/dL (ref 12.0–15.0)
Immature Granulocytes: 0 %
Lymphocytes Relative: 29 %
Lymphs Abs: 2.6 10*3/uL (ref 0.7–4.0)
MCH: 29.9 pg (ref 26.0–34.0)
MCHC: 32.9 g/dL (ref 30.0–36.0)
MCV: 91 fL (ref 80.0–100.0)
Monocytes Absolute: 0.7 10*3/uL (ref 0.1–1.0)
Monocytes Relative: 8 %
Neutro Abs: 5 10*3/uL (ref 1.7–7.7)
Neutrophils Relative %: 58 %
Platelets: 352 10*3/uL (ref 150–400)
RBC: 4.21 MIL/uL (ref 3.87–5.11)
RDW: 11.9 % (ref 11.5–15.5)
WBC: 8.7 10*3/uL (ref 4.0–10.5)
nRBC: 0 % (ref 0.0–0.2)

## 2019-05-17 NOTE — Discharge Instructions (Signed)
Please call one of the listed neurology groups to schedule a follow-up appointment.  You may suffer future migraines and this follow-up will be helpful in managing recurrent migraines.

## 2020-10-06 IMAGING — CT CT HEAD WITHOUT CONTRAST
3 series · 14 of 45 positions shown, 16 images · non-contrast
Comparison: None.

CLINICAL DATA: Severe headache

EXAM:
CT HEAD WITHOUT CONTRAST
TECHNIQUE: Contiguous axial images were obtained from the base of the skull
through the vertex without intravenous contrast.

[Series 2: head wo · axial · 0.40mm/px · z∈[-147,-32]mm · 8 of 28 slices shown, 10 images]
[im 3/28  brain]
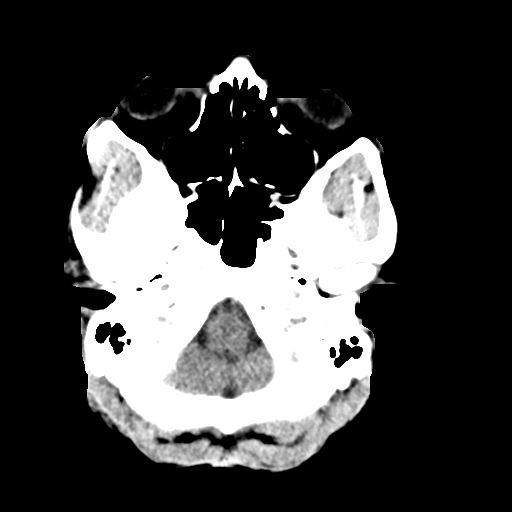
[im 3/28  bone]
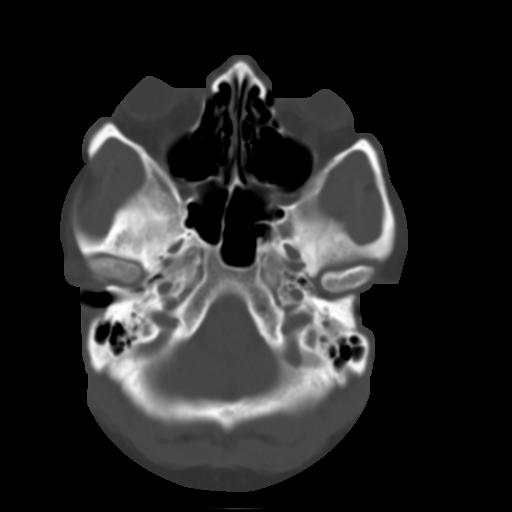
[im 6/28  brain]
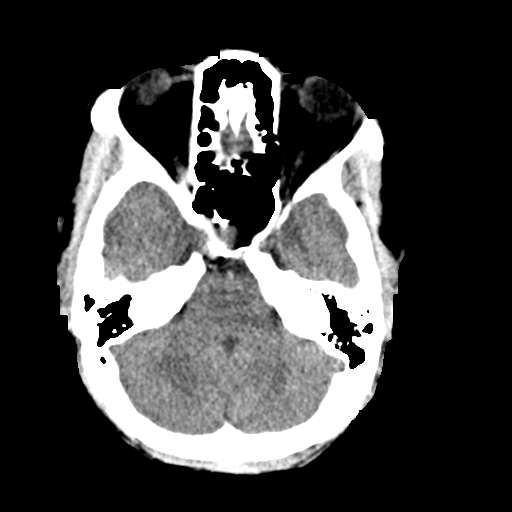
[im 10/28  brain]
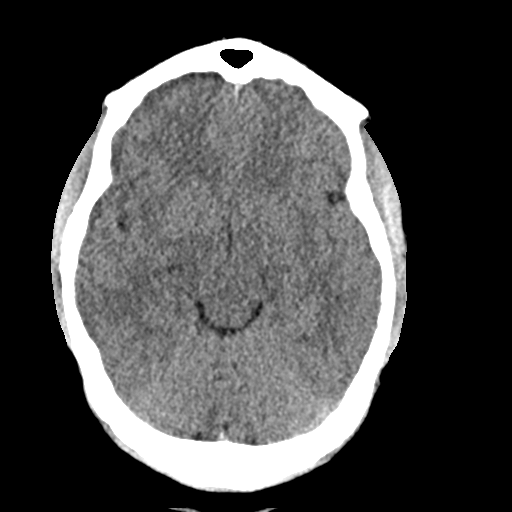
[im 13/28  brain]
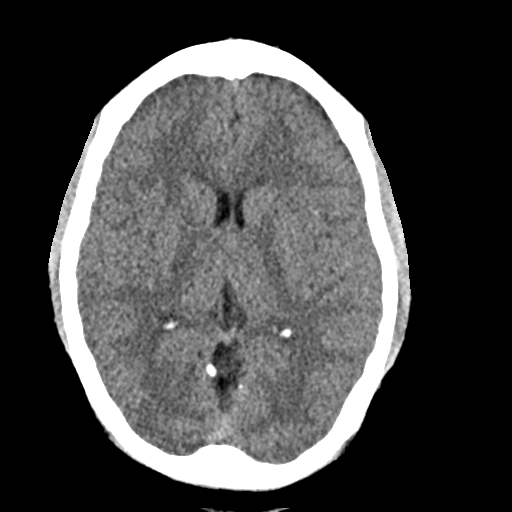
[im 16/28  brain]
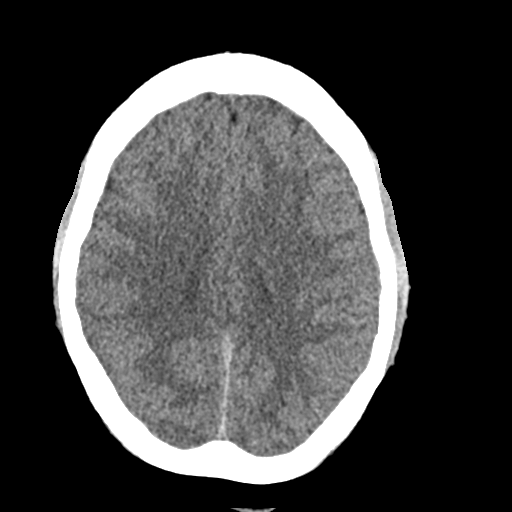
[im 16/28  bone]
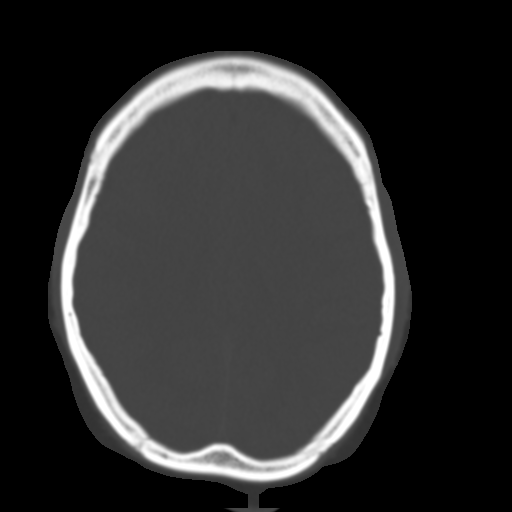
[im 19/28  brain]
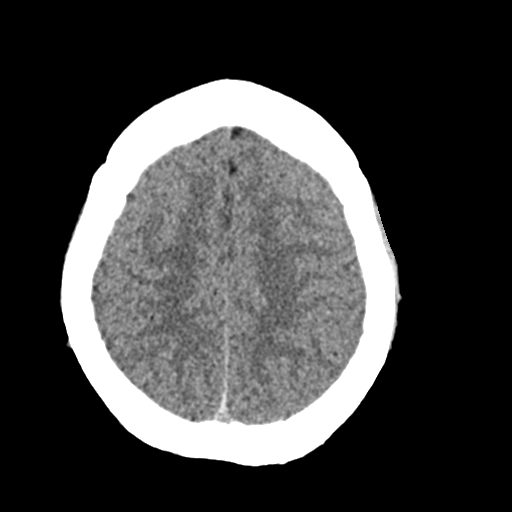
[im 23/28  brain]
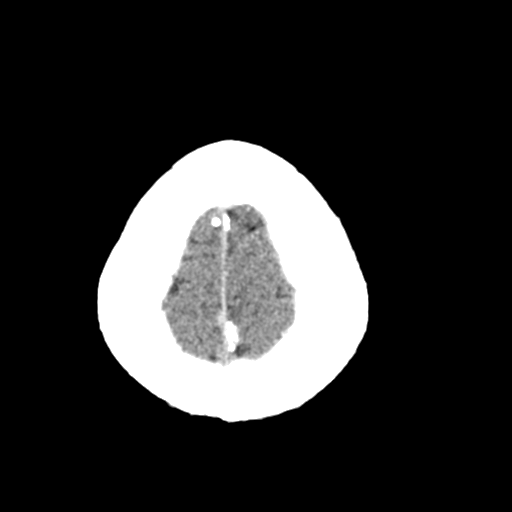
[im 26/28  brain]
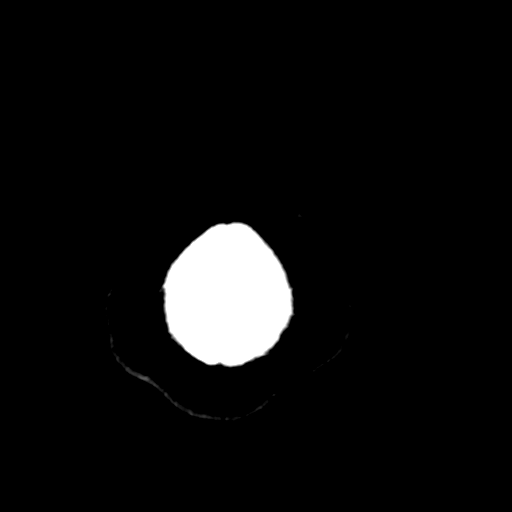

[Series 4: coronal soft · coronal · 0.27mm/px · 3 of 63 slices shown]
[im 21/63  brain]
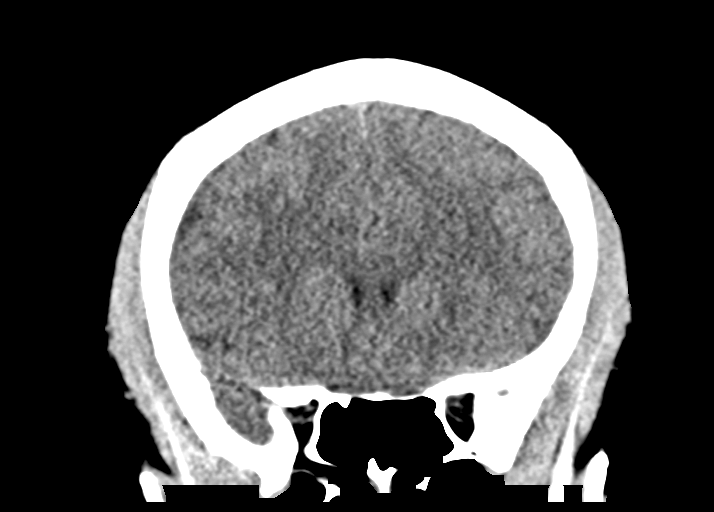
[im 28/63  brain]
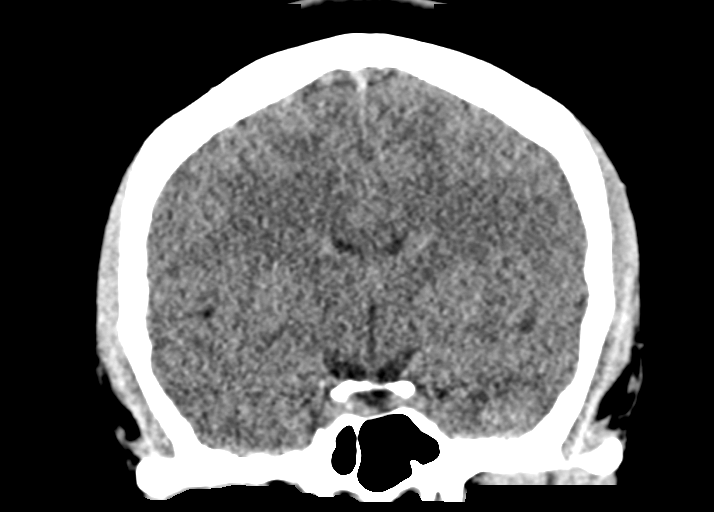
[im 35/63  brain]
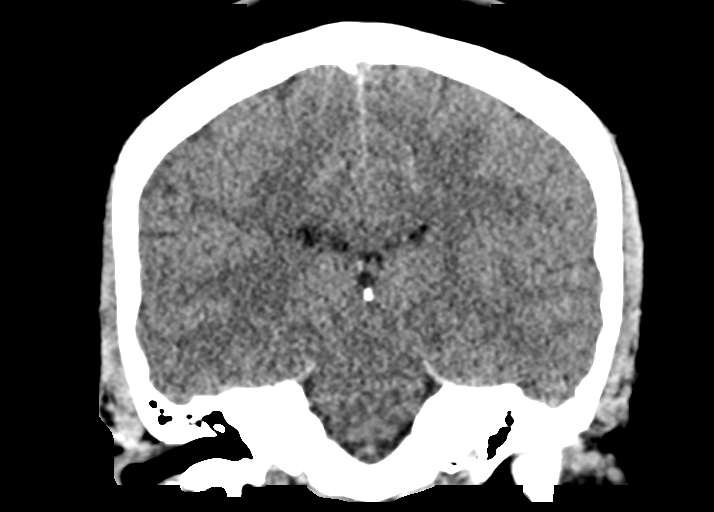

[Series 5: sag soft · sagittal · 0.28mm/px · 3 of 51 slices shown]
[im 17/51  brain]
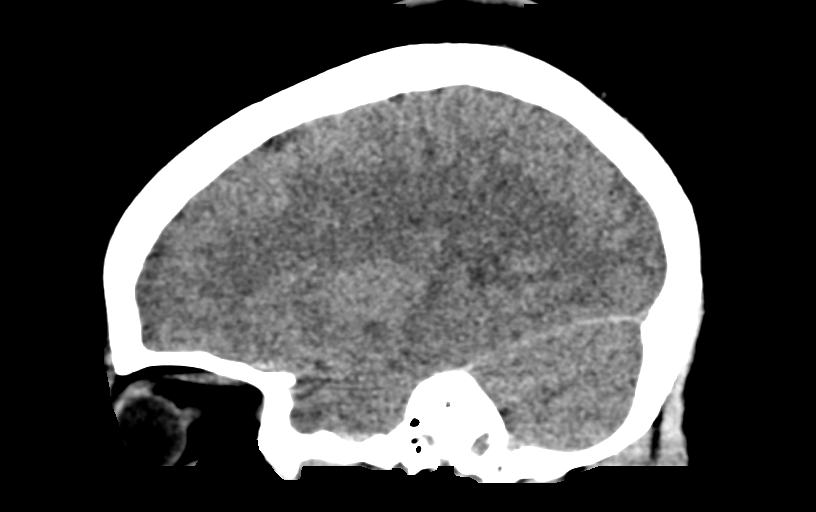
[im 26/51  brain]
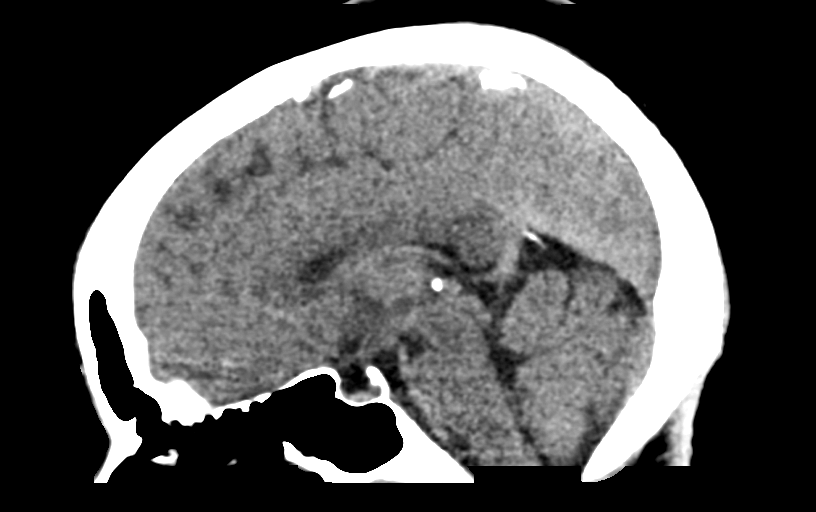
[im 34/51  brain]
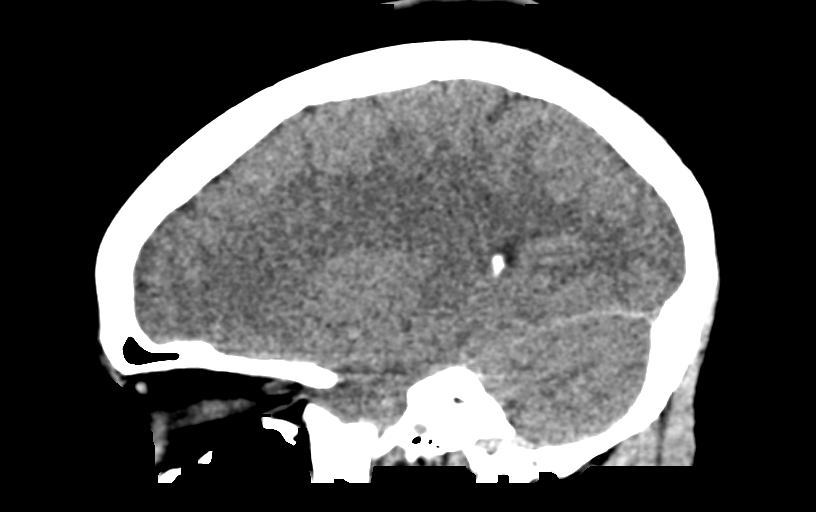

[14 of 45 positions shown; findings below may reference images not displayed]

FINDINGS: Brain: No evidence of acute infarction, hemorrhage, hydrocephalus,
extra-axial collection or mass lesion/mass effect.

Vascular: No hyperdense vessel or unexpected calcification.

Skull: Normal. Negative for fracture or focal lesion.

Sinuses/Orbits: No acute finding.

Other: None
IMPRESSION: Negative non contrasted CT appearance of the brain

## 2021-07-15 ENCOUNTER — Other Ambulatory Visit: Payer: Self-pay

## 2021-07-15 ENCOUNTER — Emergency Department (HOSPITAL_BASED_OUTPATIENT_CLINIC_OR_DEPARTMENT_OTHER)
Admission: EM | Admit: 2021-07-15 | Discharge: 2021-07-15 | Disposition: A | Discharge status: Home / Self Care | Payer: BC Managed Care – PPO | Attending: Emergency Medicine | Admitting: Emergency Medicine

## 2021-07-15 ENCOUNTER — Encounter (HOSPITAL_BASED_OUTPATIENT_CLINIC_OR_DEPARTMENT_OTHER): Payer: Self-pay | Admitting: *Deleted

## 2021-07-15 DIAGNOSIS — M5431 Sciatica, right side: Secondary | ICD-10-CM | POA: Diagnosis not present

## 2021-07-15 DIAGNOSIS — M79606 Pain in leg, unspecified: Secondary | ICD-10-CM | POA: Diagnosis present

## 2021-07-15 MED ORDER — MELOXICAM 15 MG PO TABS: 15.0000 mg | ORAL_TABLET | Freq: Every day | ORAL | 0 refills | Status: AC

## 2021-07-15 MED ORDER — PREDNISONE 10 MG PO TABS: ORAL_TABLET | ORAL | 0 refills | Status: AC

## 2021-07-15 MED ORDER — CYCLOBENZAPRINE HCL 10 MG PO TABS: 10.0000 mg | ORAL_TABLET | Freq: Two times a day (BID) | ORAL | 0 refills | Status: AC | PRN

## 2021-07-15 NOTE — Discharge Instructions (Signed)
Take the medication as prescribed to see if it helps with your leg pain.  Follow-up with the referral provided by your primary care doctor.  You can also use topical lidocaine patches to help with pain.  There are over-the-counter brands, one of them is Salonpas.

## 2021-07-15 NOTE — ED Triage Notes (Signed)
R leg pain and R hip pain.  Pain started on weekend of Oct. 15th.  Pt. Has seen her PCP on 13th and was to see PCP again and to see Ortho on Nov.15th and start PT.  Pt. Said she can't wait till then due to the pain in the R leg.  Pt. Reports she has pain that starts in the Upper R lower back and R buttock region that runs down the R leg into the back side of the hamstring and just below the R knee.  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

## 2021-07-15 NOTE — ED Provider Notes (Signed)
[facility] EMERGENCY DEPARTMENT Provider Note   CSN: [id number] Arrival date & time: 07/15/21  0801     History Chief Complaint  Patient presents with   Leg Pain    Donna Pearson is a 35 y.o. female.   Leg Pain  Patient presents ED with complaints of leg pain.  Patient states she started having symptoms a couple weeks ago.  She initially was having pain primarily in her leg.  She saw her primary care doctor and was given prescription for NSAIDs.  She was also given a referral to orthopedics and to start physical therapy.  Patient states since that time the pain has increased in intensity and the symptoms have not improved at all.  She now notices the pain goes up to her upper thigh and leg but also into her back.  It radiates down to below the knee.  Patient does a lot of standing with her job and its become more difficult.  She is not having any numbness or weakness.  No fevers or chills.  No urinary symptoms  Past Medical History:  Diagnosis Date   Sickle cell trait ([facility])     There are no problems to display for this patient.   No past surgical history on file.   OB History     Gravida  2   Para  1   Term      Preterm      AB      Living         SAB      IAB      Ectopic      Multiple      Live Births              No family history on file.  Social History   Tobacco Use   Smoking status: Never   Smokeless tobacco: Never  Vaping Use   Vaping Use: Never used  Substance Use Topics   Alcohol use: Never   Drug use: Never    Home Medications Prior to Admission medications   Medication Sig Start Date End Date Taking? Authorizing Provider  cyclobenzaprine (FLEXERIL) 10 MG tablet Take 1 tablet (10 mg total) by mouth 2 (two) times daily as needed for muscle spasms. 07/15/21  Yes Linwood Dibbles, MD  meloxicam (MOBIC) 15 MG tablet Take 1 tablet (15 mg total) by mouth daily for 14 days. 07/15/21 07/29/21 Yes Linwood Dibbles, MD  predniSONE  (DELTASONE) 10 MG tablet Take 5 tablets (50 mg total) by mouth daily with breakfast for 2 days, THEN 4 tablets (40 mg total) daily with breakfast for 2 days, THEN 3 tablets (30 mg total) daily with breakfast for 2 days, THEN 2 tablets (20 mg total) daily with breakfast for 2 days, THEN 1 tablet (10 mg total) daily with breakfast for 2 days. 07/15/21 07/25/21 Yes Linwood Dibbles, MD  Prenatal Vit-Fe Fumarate-FA (MULTIVITAMIN-PRENATAL) 27-0.8 MG TABS Take 1 tablet by mouth daily.      [provider]    Allergies    Patient has no known allergies.  Review of Systems   Review of Systems  All other systems reviewed and are negative.  Physical Exam Updated Vital Signs BP (!) 126/95 (BP Location: Right Arm)   Pulse 81   Temp 98.7 F (37.1 C) (Oral)   Resp 20   Ht 1.651 m (5\' 5" )   Wt 101.6 kg   LMP 06/27/2021   SpO2 99%   BMI 37.28 kg/m  Physical Exam Vitals and nursing note reviewed.  Constitutional:      General: She is not in acute distress.    Appearance: She is well-developed.  HENT:     Head: Normocephalic and atraumatic.     Right Ear: External ear normal.     Left Ear: External ear normal.  Eyes:     General: No scleral icterus.       Right eye: No discharge.        Left eye: No discharge.     Conjunctiva/sclera: Conjunctivae normal.  Neck:     Trachea: No tracheal deviation.  Cardiovascular:     Rate and Rhythm: Normal rate.  Pulmonary:     Effort: Pulmonary effort is normal. No respiratory distress.     Breath sounds: No stridor.  Abdominal:     General: There is no distension.  Musculoskeletal:        General: No swelling, tenderness or deformity.     Cervical back: Neck supple.     Comments: No focal area of tenderness although some discomfort with trying to raise her leg off the bed  Skin:    General: Skin is warm and dry.     Findings: No rash.  Neurological:     Mental Status: She is alert.     Cranial Nerves: Cranial nerve deficit: no gross  deficits.     Comments: Normal strength and sensation bilateral lower extremities    ED Results / Procedures / Treatments   Labs (all labs ordered are listed, but only abnormal results are displayed) Labs Reviewed - No data to display  EKG None  Radiology No results found.  Procedures Procedures   Medications Ordered in ED Medications - No data to display  ED Course  I have reviewed the triage vital signs and the nursing notes.  Pertinent labs & imaging results that were available during my care of the patient were reviewed by me and considered in my medical decision making (see chart for details).    MDM Rules/Calculators/A&P                           Patient denies any recent trauma.  She did have x-rays at her primary care doctor's office.  Exam is suggestive of sciatica.  She does have referral to orthopedics and physical therapy.  She has no acute neurologic dysfunction on exam.  We will try prescription of steroids and muscle relaxants.  Also discussed use of topical lidocaine patches. Final Clinical Impression(s) / ED Diagnoses Final diagnoses:  Sciatica of right side    Rx / DC Orders ED Discharge Orders          Ordered    cyclobenzaprine (FLEXERIL) 10 MG tablet  2 times daily PRN        07/15/21 0845    predniSONE (DELTASONE) 10 MG tablet        07/15/21 0845    meloxicam (MOBIC) 15 MG tablet  Daily        07/15/21 0845             Linwood Dibbles, MD 07/15/21 [phone]

## 2021-09-11 ENCOUNTER — Encounter (HOSPITAL_BASED_OUTPATIENT_CLINIC_OR_DEPARTMENT_OTHER): Payer: Self-pay

## 2021-09-11 ENCOUNTER — Other Ambulatory Visit: Payer: Self-pay

## 2021-09-11 DIAGNOSIS — I10 Essential (primary) hypertension: Secondary | ICD-10-CM | POA: Diagnosis not present

## 2021-09-11 DIAGNOSIS — K0889 Other specified disorders of teeth and supporting structures: Secondary | ICD-10-CM | POA: Diagnosis present

## 2021-09-11 DIAGNOSIS — K047 Periapical abscess without sinus: Secondary | ICD-10-CM | POA: Insufficient documentation

## 2021-09-11 DIAGNOSIS — K029 Dental caries, unspecified: Secondary | ICD-10-CM | POA: Insufficient documentation

## 2021-09-11 NOTE — ED Triage Notes (Signed)
Pt c/o left lower dental pain-denies hx of HTN-states she has hx of elevated BP "only when i'm in pain-"-NAD-steady gait

## 2021-09-12 ENCOUNTER — Emergency Department (HOSPITAL_BASED_OUTPATIENT_CLINIC_OR_DEPARTMENT_OTHER)
Admission: EM | Admit: 2021-09-12 | Discharge: 2021-09-12 | Disposition: A | Discharge status: Home / Self Care | Payer: BC Managed Care – PPO | Attending: Emergency Medicine | Admitting: Emergency Medicine

## 2021-09-12 DIAGNOSIS — K047 Periapical abscess without sinus: Secondary | ICD-10-CM

## 2021-09-12 HISTORY — DX: Pure hypercholesterolemia, unspecified: E78.00

## 2021-09-12 MED ORDER — ACETAMINOPHEN 500 MG PO TABS
1000.0000 mg | ORAL_TABLET | Freq: Once | ORAL | Status: AC
  Administered 2021-09-12: 01:00:00 1000 mg via ORAL
  Filled 2021-09-12: qty 2

## 2021-09-12 MED ORDER — AMOXICILLIN-POT CLAVULANATE 875-125 MG PO TABS
1.0000 | ORAL_TABLET | Freq: Once | ORAL | Status: AC
  Administered 2021-09-12: 01:00:00 1 via ORAL
  Filled 2021-09-12: qty 1

## 2021-09-12 MED ORDER — AMOXICILLIN-POT CLAVULANATE 875-125 MG PO TABS: 1.0000 | ORAL_TABLET | Freq: Two times a day (BID) | ORAL | 0 refills | Status: AC

## 2021-09-12 NOTE — ED Provider Notes (Signed)
[facility] EMERGENCY DEPARTMENT Provider Note  CSN: [id number] Arrival date & time: 09/11/21 2130  Chief Complaint(s) Dental Pain  HPI Donna Pearson is a 35 y.o. female    Dental Pain Location:  Lower Lower teeth location:  17/LL 3rd molar Quality:  Aching, constant and throbbing Severity:  Moderate Onset quality:  Gradual Duration:  2 months Progression:  Waxing and waning Chronicity:  Recurrent Context: abscess and dental caries   Relieved by: Abx. Worsened by:  Touching Associated symptoms: oral bleeding   Associated symptoms: no congestion, no difficulty swallowing, no drooling, no facial swelling, no fever and no headaches   Risk factors: no diabetes    She has seen a dentist and looking for an oral surgeon to have the tooth removed.  Past Medical History Past Medical History:  Diagnosis Date   High cholesterol    Sickle cell trait (HCC)    There are no problems to display for this patient.  Home Medication(s) Prior to Admission medications   Medication Sig Start Date End Date Taking? Authorizing Provider  amoxicillin-clavulanate (AUGMENTIN) 875-125 MG tablet Take 1 tablet by mouth every 12 (twelve) hours for 10 days. 09/12/21 09/22/21 Yes Kea Callan, Amadeo Garnet, MD  cyclobenzaprine (FLEXERIL) 10 MG tablet Take 1 tablet (10 mg total) by mouth 2 (two) times daily as needed for muscle spasms. 07/15/21   Linwood Dibbles, MD  Prenatal Vit-Fe Fumarate-FA (MULTIVITAMIN-PRENATAL) 27-0.8 MG TABS Take 1 tablet by mouth daily.      [provider]                                                                                                                                    Past Surgical History History reviewed. No pertinent surgical history. Family History No family history on file.  Social History Social History   Tobacco Use   Smoking status: Never   Smokeless tobacco: Never  Vaping Use   Vaping Use: Never used  Substance Use Topics    Alcohol use: Yes    Comment: occ   Drug use: Never   Allergies Patient has no known allergies.  Review of Systems Review of Systems  Constitutional:  Negative for fever.  HENT:  Negative for congestion, drooling and facial swelling.   Neurological:  Negative for headaches.  All other systems are reviewed and are negative for acute change except as noted in the HPI  Physical Exam Vital Signs  I have reviewed the triage vital signs BP (!) 148/110    Pulse 86    Temp 99.5 F (37.5 C) (Oral)    Resp 18    Ht 5\' 5"  (1.651 m)    Wt 98.4 kg    LMP 08/25/2021    SpO2 98%    BMI 36.11 kg/m   Physical Exam Vitals reviewed.  Constitutional:      General: She is not in acute distress.    Appearance: She  is well-developed. She is not diaphoretic.  HENT:     Head: Normocephalic and atraumatic.     Right Ear: External ear normal.     Left Ear: External ear normal.     Nose: Nose normal.     Mouth/Throat:     Dentition: Dental tenderness and dental caries present. No gingival swelling or dental abscesses.   Eyes:     General: No scleral icterus.    Conjunctiva/sclera: Conjunctivae normal.  Neck:     Trachea: Phonation normal.  Cardiovascular:     Rate and Rhythm: Normal rate and regular rhythm.  Pulmonary:     Effort: Pulmonary effort is normal. No respiratory distress.     Breath sounds: No stridor.  Abdominal:     General: There is no distension.  Musculoskeletal:        General: Normal range of motion.     Cervical back: Normal range of motion.  Neurological:     Mental Status: She is alert and oriented to person, place, and time.  Psychiatric:        Behavior: Behavior normal.    ED Results and Treatments Labs (all labs ordered are listed, but only abnormal results are displayed) Labs Reviewed - No data to display                                                                                                                       EKG  EKG Interpretation  Date/Time:     Ventricular Rate:    PR Interval:    QRS Duration:   QT Interval:    QTC Calculation:   R Axis:     Text Interpretation:         Radiology No results found.  Pertinent labs & imaging results that were available during my care of the patient were reviewed by me and considered in my medical decision making (see MDM for details).  Medications Ordered in ED Medications  acetaminophen (TYLENOL) tablet 1,000 mg (has no administration in time range)  amoxicillin-clavulanate (AUGMENTIN) 875-125 MG per tablet 1 tablet (has no administration in time range)                                                                                                                                     Procedures Procedures  (including critical care time)  Medical Decision Making / ED Course I have reviewed the  nursing notes for this encounter and the patient's prior records (if available in EHR or on provided paperwork).  Donna Pearson was evaluated in Emergency Department on 09/12/2021 for the symptoms described in the history of present illness. She was evaluated in the context of the global COVID-19 pandemic, which necessitated consideration that the patient might be at risk for infection with the SARS-CoV-2 virus that causes COVID-19. Institutional protocols and algorithms that pertain to the evaluation of patients at risk for COVID-19 are in a state of rapid change based on information released by regulatory bodies including the CDC and federal and state organizations. These policies and algorithms were followed during the patient's care in the ED.     Infected dental cary. No drainable abscess noted. No evidence of deep tissue infection. She declined nerve block. Actively looking for oral surgeon - none on call tonight for [place].  Given tylenol and Augmentin.  Noted to have HTN. She reports it's up due to pain and infection. Was up previously and went down when she treated tooth infection with  abx. She saw her PCP who was going to start her on meds, but BP improved.  Continue to monitor and f/u with PCP   Final Clinical Impression(s) / ED Diagnoses Final diagnoses:  Infected dental caries   The patient appears reasonably screened and/or stabilized for discharge and I doubt any other medical condition or other [facility] requiring further screening, evaluation, or treatment in the ED at this time prior to discharge. Safe for discharge with strict return precautions.  Disposition: Discharge  Condition: Good  I have discussed the results, Dx and Tx plan with the patient/family who expressed understanding and agree(s) with the plan. Discharge instructions discussed at length. The patient/family was given strict return precautions who verbalized understanding of the instructions. No further questions at time of discharge.    ED Discharge Orders          Ordered    amoxicillin-clavulanate (AUGMENTIN) 875-125 MG tablet  Every 12 hours        09/12/21 0107              Follow Up: No follow-up provider specified.    This chart was dictated using voice recognition software.  Despite best efforts to proofread,  errors can occur which can change the documentation meaning.    Nira Conn, MD 09/12/21 [phone]

## 2024-04-17 ENCOUNTER — Emergency Department (HOSPITAL_BASED_OUTPATIENT_CLINIC_OR_DEPARTMENT_OTHER)

## 2024-04-17 ENCOUNTER — Emergency Department (HOSPITAL_BASED_OUTPATIENT_CLINIC_OR_DEPARTMENT_OTHER)
Admission: EM | Admit: 2024-04-17 | Discharge: 2024-04-17 | Disposition: A | Discharge status: Home / Self Care | Source: Ambulatory Visit | Attending: Emergency Medicine | Admitting: Emergency Medicine

## 2024-04-17 ENCOUNTER — Encounter (HOSPITAL_BASED_OUTPATIENT_CLINIC_OR_DEPARTMENT_OTHER): Payer: Self-pay | Admitting: Emergency Medicine

## 2024-04-17 ENCOUNTER — Other Ambulatory Visit: Payer: Self-pay

## 2024-04-17 DIAGNOSIS — M7989 Other specified soft tissue disorders: Secondary | ICD-10-CM | POA: Insufficient documentation

## 2024-04-17 LAB — CBC WITH DIFFERENTIAL/PLATELET
Abs Immature Granulocytes: 0.03 K/uL (ref 0.00–0.07)
Basophils Absolute: 0.1 K/uL (ref 0.0–0.1)
Basophils Relative: 1 %
Eosinophils Absolute: 0.1 K/uL (ref 0.0–0.5)
Eosinophils Relative: 2 %
HCT: 35.4 % — ABNORMAL LOW (ref 36.0–46.0)
Hemoglobin: 11.8 g/dL — ABNORMAL LOW (ref 12.0–15.0)
Immature Granulocytes: 0 %
Lymphocytes Relative: 21 %
Lymphs Abs: 1.9 K/uL (ref 0.7–4.0)
MCH: 29.1 pg (ref 26.0–34.0)
MCHC: 33.3 g/dL (ref 30.0–36.0)
MCV: 87.2 fL (ref 80.0–100.0)
Monocytes Absolute: 0.7 K/uL (ref 0.1–1.0)
Monocytes Relative: 7 %
Neutro Abs: 6.4 K/uL (ref 1.7–7.7)
Neutrophils Relative %: 69 %
Platelets: 455 K/uL — ABNORMAL HIGH (ref 150–400)
RBC: 4.06 MIL/uL (ref 3.87–5.11)
RDW: 12.6 % (ref 11.5–15.5)
WBC: 9.2 K/uL (ref 4.0–10.5)
nRBC: 0 % (ref 0.0–0.2)

## 2024-04-17 LAB — BASIC METABOLIC PANEL WITH GFR
Anion gap: 10 (ref 5–15)
BUN: 9 mg/dL (ref 6–20)
CO2: 25 mmol/L (ref 22–32)
Calcium: 9.2 mg/dL (ref 8.9–10.3)
Chloride: 103 mmol/L (ref 98–111)
Creatinine, Ser: 0.98 mg/dL (ref 0.44–1.00)
GFR, Estimated: >60 mL/min (ref >60)
Glucose, Bld: 108 mg/dL — ABNORMAL HIGH (ref 70–99)
Potassium: 3.9 mmol/L (ref 3.5–5.1)
Sodium: 138 mmol/L (ref 135–145)

## 2024-04-17 LAB — PROTIME-INR
INR: 1 (ref 0.8–1.2)
Prothrombin Time: 13.8 s (ref 11.4–15.2)

## 2024-04-17 NOTE — ED Provider Notes (Signed)
 [facility] AT [facility] Provider Note   CSN: [id number] Arrival date & time: 04/17/24  1451     Patient presents with: Leg Swelling   Donna Pearson is a 38 y.o. female.  She is here with a complaint of some right leg burning discomfort going on for the last 3 days.  She said she had an episode like this a few years ago and I told her it was her sciatic nerve and arthritis.  She does not recall any trauma.  She talked to her primary care doctor who recommended she come here to get an ultrasound to make sure she does not have a blood clot.  Patient tells me that doctor has been following her for her low platelet count.  She does not know the cause of her low platelets.  She said she found out about this a few months ago.  No fevers chills chest pain shortness of breath.  No weakness but she does feel some numbness in her toes at times.  {Add pertinent medical, surgical, social history, OB history to YEP:67052} The history is provided by the patient.  Leg Pain Location:  Leg Leg location:  R upper leg and R lower leg Pain details:    Quality:  Burning   Severity:  Moderate   Onset quality:  Gradual   Duration:  3 days   Timing:  Intermittent   Progression:  Unchanged Chronicity:  Recurrent Relieved by:  Nothing Worsened by:  Exercise and activity Ineffective treatments:  Rest Associated symptoms: numbness   Associated symptoms: no decreased ROM, no fever, no muscle weakness and no swelling        Prior to Admission medications   Medication Sig Start Date End Date Taking? Authorizing Provider  cyclobenzaprine  (FLEXERIL ) 10 MG tablet Take 1 tablet (10 mg total) by mouth 2 (two) times daily as needed for muscle spasms. 07/15/21   Randol Simmonds, MD  Prenatal Vit-Fe Fumarate-FA (MULTIVITAMIN-PRENATAL) 27-0.8 MG TABS Take 1 tablet by mouth daily.      [provider]    Allergies: Patient has no known allergies.    Review of Systems   Constitutional:  Negative for fever.    Updated Vital Signs BP 119/87 (BP Location: Right Arm)   Pulse 89   Temp 98.5 F (36.9 C)   Resp 20   Ht 5' 5 (1.651 m)   LMP 03/25/2024 (Approximate)   SpO2 100%   Breastfeeding No   BMI 36.11 kg/m   Physical Exam Vitals and nursing note reviewed.  Constitutional:      General: She is not in acute distress.    Appearance: Normal appearance. She is well-developed.  HENT:     Head: Normocephalic and atraumatic.  Eyes:     Conjunctiva/sclera: Conjunctivae normal.  Cardiovascular:     Rate and Rhythm: Normal rate and regular rhythm.     Heart sounds: No murmur heard. Pulmonary:     Effort: Pulmonary effort is normal. No respiratory distress.     Breath sounds: Normal breath sounds. No stridor. No wheezing.  Abdominal:     Palpations: Abdomen is soft.     Tenderness: There is no abdominal tenderness. There is no guarding or rebound.  Musculoskeletal:        General: No tenderness.     Cervical back: Neck supple.     Right lower leg: No edema.     Left lower leg: No edema.     Comments: She has  no cords appreciated on her right lower leg.  Foot is well-perfused with good cap refill and pulses.  No open wounds.  Skin:    General: Skin is warm and dry.  Neurological:     General: No focal deficit present.     Mental Status: She is alert.     GCS: GCS eye subscore is 4. GCS verbal subscore is 5. GCS motor subscore is 6.     Sensory: No sensory deficit.     Motor: No weakness.     (all labs ordered are listed, but only abnormal results are displayed) Labs Reviewed  BASIC METABOLIC PANEL WITH GFR  CBC WITH DIFFERENTIAL/PLATELET  PROTIME-INR    EKG: None  Radiology: No results found.  {Document cardiac monitor, telemetry assessment procedure when appropriate:32947} Procedures   Medications Ordered in the ED - No data to display    {Click here for ABCD2, HEART and other calculators REFRESH Note before signing:1}                               Medical Decision Making Amount and/or Complexity of Data Reviewed Labs: ordered.   This patient complains of ***; this involves an extensive number of treatment Options and is a complaint that carries with it a high risk of complications and morbidity. The differential includes ***  I ordered, reviewed and interpreted labs, which included *** I ordered medication *** and reviewed PMP when indicated. I ordered imaging studies which included *** and I independently    visualized and interpreted imaging which showed *** Additional history obtained from *** Previous records obtained and reviewed *** I consulted *** and discussed lab and imaging findings and discussed disposition.  Cardiac monitoring reviewed, *** Social determinants considered, *** Critical Interventions: ***  After the interventions stated above, I reevaluated the patient and found *** Admission and further testing considered, ***   {Document critical care time when appropriate  Document review of labs and clinical decision tools ie CHADS2VASC2, etc  Document your independent review of radiology images and any outside records  Document your discussion with family members, caretakers and with consultants  Document social determinants of health affecting pt's care  Document your decision making why or why not admission, treatments were needed:32947:::1}   Final diagnoses:  None    ED Discharge Orders     None

## 2024-04-17 NOTE — Discharge Instructions (Signed)
 Please elevate your leg.  Tylenol  for pain.  Schedule an appointment with your primary care doctor for close follow-up.  Return to the emergency department if any worsening or concerning symptoms

## 2024-04-17 NOTE — Telephone Encounter (Signed)
 Appointment for: [place] ([id number]) Visit type: OFFICE VISIT ONLINE ([id number]) 04/29/2024 11:00 AM (20 minutes) with Ronal Corona, PA-C in [facility]   Patient comments: Pain and swelling  Patient calling to report right leg pain and swelling, redness to thigh and knee yesterday, has subsided today, toes feel numb at times. Pain located from thigh down to foot. Symptoms started on Monday 04/11/2024. Patient reports, her platelet levels were elevated last OV. She reports, her leg feels heavy with ambulating. Patient reports, yesterday swelling was worse, entire leg was severely swollen and red, has gone down some today.   Denies fever, sob, difficulty breathing, wheezing, chest pain or pressure, history of blood clots in legs or lungs, hard lump along vein, severe swelling, entire foot is cool or blue in comparison to other side.   Patient was advised ED/UC now per triage protocol. Patient reports, she will go to [facility] ED now for evaluation.

## 2024-04-17 NOTE — ED Triage Notes (Signed)
 Pt POV sent by PCP due to RLE swelling x 2 weeks. Reports worsening pain since Monday, with paraesthesia. Denies temperature change.
